# Patient Record
Sex: Male | Born: 1940 | Race: White | Hispanic: No | Marital: Married
Health system: Southern US, Community
[De-identification: ages and names within clinical notes are randomized; demographics above are authoritative.]

---

## 2006-05-06 ENCOUNTER — Ambulatory Visit: Payer: Self-pay | Admitting: Urology

## 2008-04-29 ENCOUNTER — Observation Stay: Payer: Self-pay | Admitting: Internal Medicine

## 2009-11-26 ENCOUNTER — Ambulatory Visit: Payer: Self-pay | Admitting: Internal Medicine

## 2010-01-24 ENCOUNTER — Inpatient Hospital Stay: Payer: Self-pay | Admitting: Internal Medicine

## 2010-03-03 ENCOUNTER — Encounter: Payer: Self-pay | Admitting: Internal Medicine

## 2010-03-21 ENCOUNTER — Encounter: Payer: Self-pay | Admitting: Internal Medicine

## 2010-04-21 ENCOUNTER — Encounter: Payer: Self-pay | Admitting: Internal Medicine

## 2010-07-26 ENCOUNTER — Observation Stay: Payer: Self-pay | Admitting: Internal Medicine

## 2010-08-17 ENCOUNTER — Ambulatory Visit: Payer: Self-pay | Admitting: Urology

## 2010-10-15 ENCOUNTER — Inpatient Hospital Stay: Payer: Self-pay | Admitting: Internal Medicine

## 2010-10-15 ENCOUNTER — Ambulatory Visit: Payer: Self-pay | Admitting: Internal Medicine

## 2010-10-17 LAB — AFP TUMOR MARKER: AFP-Tumor Marker: 25.2 ng/mL — ABNORMAL HIGH (ref 0.0–8.3)

## 2010-10-17 LAB — CEA: CEA: 0.6 ng/mL (ref 0.0–4.7)

## 2010-10-17 LAB — CANCER ANTIGEN 19-9: CA 19-9: 9 U/mL (ref 0–35)

## 2010-10-18 ENCOUNTER — Ambulatory Visit: Payer: Self-pay | Admitting: Oncology

## 2010-10-20 ENCOUNTER — Ambulatory Visit: Payer: Self-pay | Admitting: Oncology

## 2010-10-27 ENCOUNTER — Ambulatory Visit: Payer: Self-pay | Admitting: Oncology

## 2010-11-03 LAB — PATHOLOGY REPORT

## 2010-11-20 ENCOUNTER — Ambulatory Visit: Payer: Self-pay | Admitting: Oncology

## 2010-12-03 ENCOUNTER — Ambulatory Visit: Payer: Self-pay | Admitting: Gastroenterology

## 2010-12-07 LAB — PATHOLOGY REPORT

## 2010-12-20 ENCOUNTER — Ambulatory Visit: Payer: Self-pay | Admitting: Oncology

## 2010-12-20 ENCOUNTER — Ambulatory Visit: Payer: Self-pay | Admitting: Internal Medicine

## 2011-01-11 ENCOUNTER — Inpatient Hospital Stay: Payer: Self-pay | Admitting: Oncology

## 2011-01-20 ENCOUNTER — Ambulatory Visit: Payer: Self-pay | Admitting: Oncology

## 2011-02-20 ENCOUNTER — Ambulatory Visit: Payer: Self-pay | Admitting: Oncology

## 2011-03-22 ENCOUNTER — Ambulatory Visit: Payer: Self-pay | Admitting: Oncology

## 2011-03-22 ENCOUNTER — Ambulatory Visit: Payer: Self-pay | Admitting: Internal Medicine

## 2011-03-23 ENCOUNTER — Inpatient Hospital Stay: Payer: Self-pay | Admitting: Internal Medicine

## 2011-05-22 ENCOUNTER — Ambulatory Visit: Payer: Self-pay | Admitting: Internal Medicine

## 2011-06-08 ENCOUNTER — Inpatient Hospital Stay: Payer: Self-pay | Admitting: Internal Medicine

## 2011-06-22 ENCOUNTER — Ambulatory Visit: Payer: Self-pay | Admitting: Internal Medicine

## 2011-07-23 DEATH — deceased

## 2011-09-13 IMAGING — CT CT ABDOMEN AND PELVIS WITHOUT AND WITH CONTRAST
2 of 4 series · 13 of 32 positions shown, 18 images · IV contrast (isovue)
Comparison: none

REASON FOR EXAM: (1) cholangiocarcinoma, pain, severe periferal edema.;
(2) cholangiocarcinoma, p
COMMENTS:   LMP: (Male)

PROCEDURE:     CT  - CT ABDOMEN / PELVIS  W/WO  - January 13, 2011 [DATE]
RESULT:     Comparison:  CT the abdomen and pelvis 10/16/2010 and MRI the
abdomen 10/17/2010. PET CT 10/19/2010.
TECHNIQUE: Multiple axial images of the abdomen and pelvis were performed
from the lung bases to the pubic symphysis, with p.o. contrast and with 100
mL of Isovue 370 intravenous contrast. Precontrast and delayed phase images
were performed.

[Series 2: wo · axial · 0.98mm/px · z∈[+100,+470]mm · 8 of 96 slices shown, 13 images]
[im 11/96  soft-tissue]
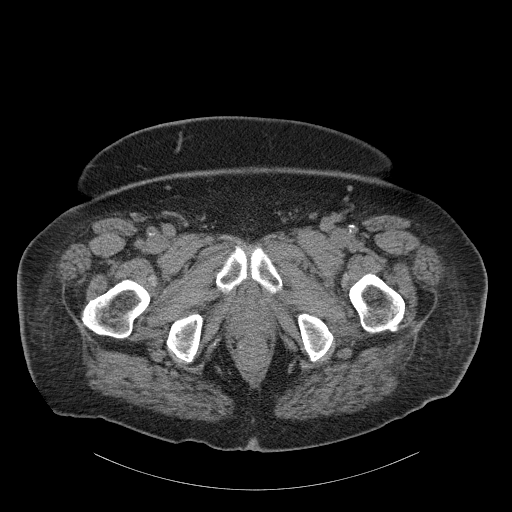
[im 11/96  bone]
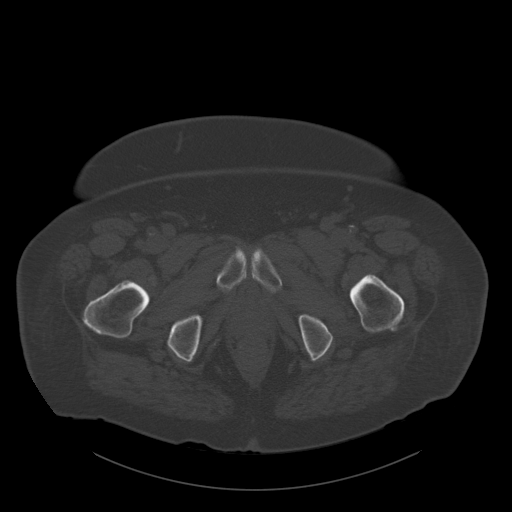
[im 22/96  soft-tissue]
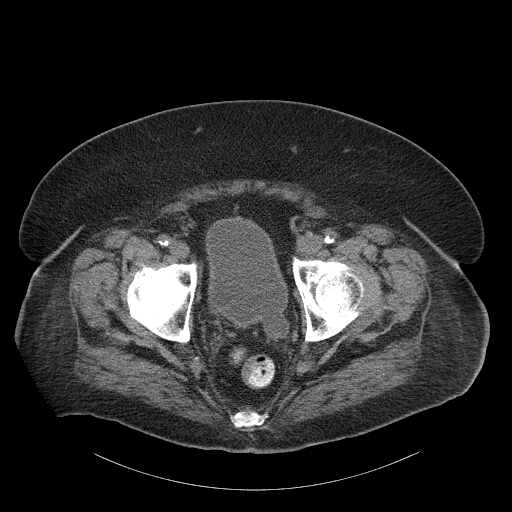
[im 32/96  soft-tissue]
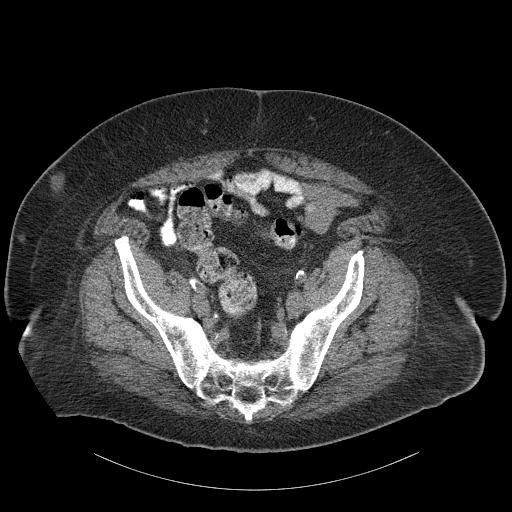
[im 43/96  soft-tissue]
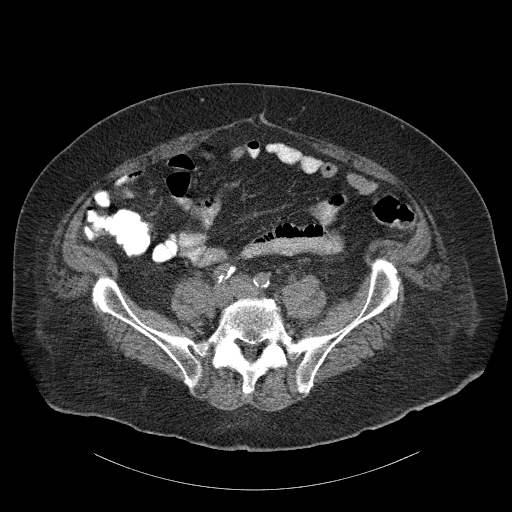
[im 53/96  soft-tissue]
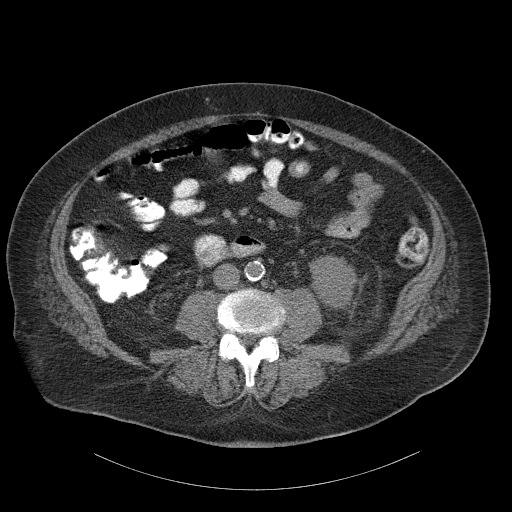
[im 53/96  lung]
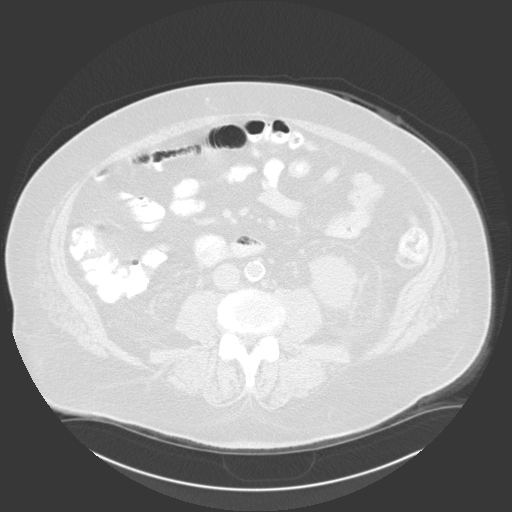
[im 64/96  soft-tissue]
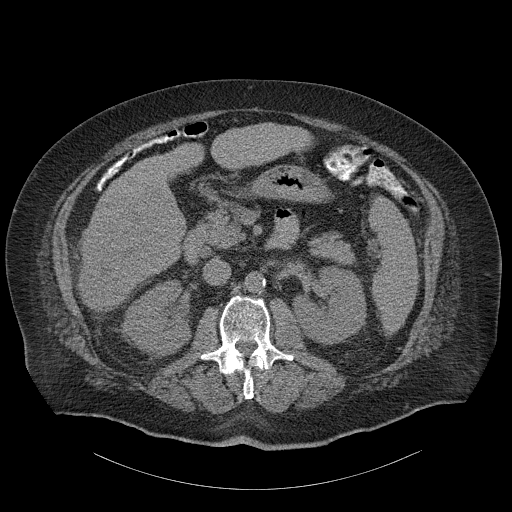
[im 64/96  lung]
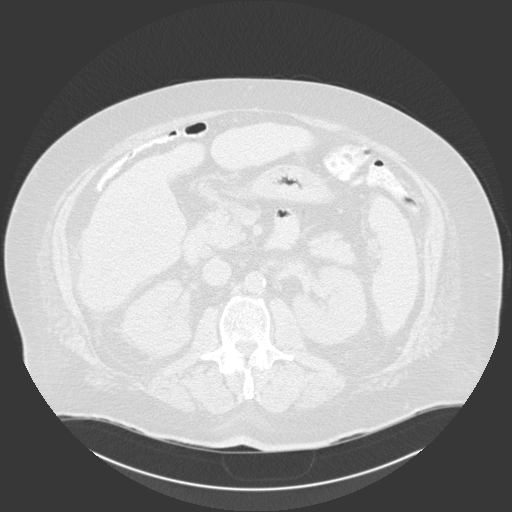
[im 74/96  soft-tissue]
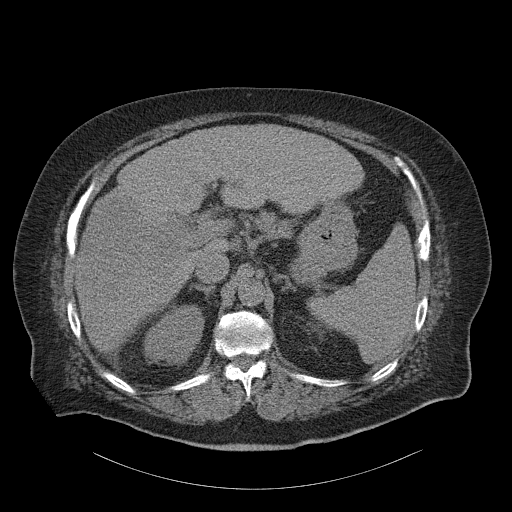
[im 74/96  lung]
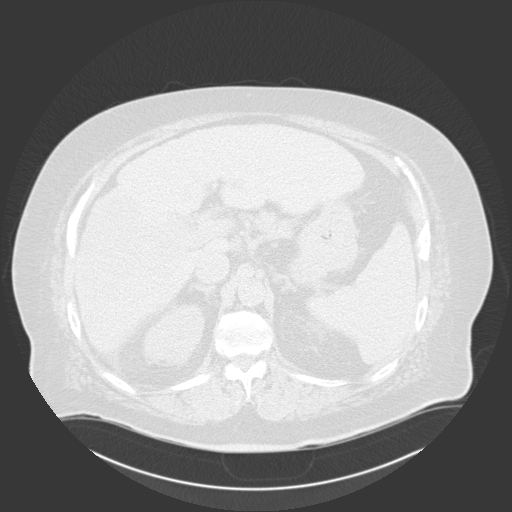
[im 85/96  soft-tissue]
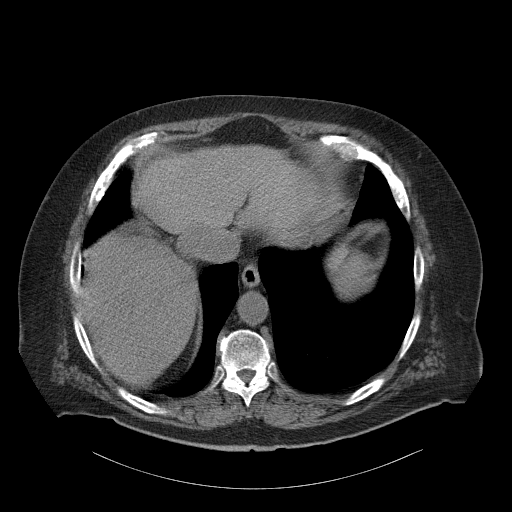
[im 85/96  lung]
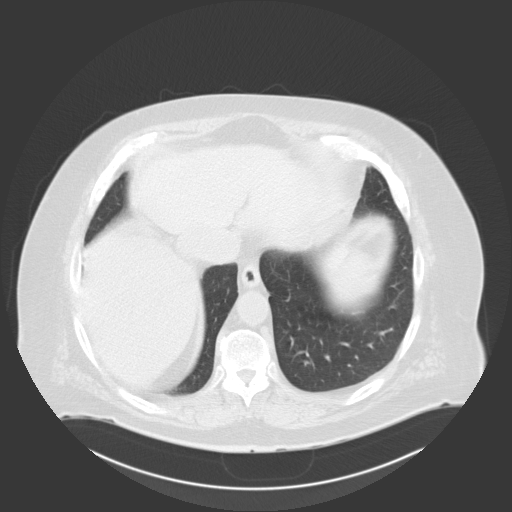

[Series 3: with · axial · 0.98mm/px · z∈[+100,+310]mm · 5 of 96 slices shown]
[im 11/96  soft-tissue]
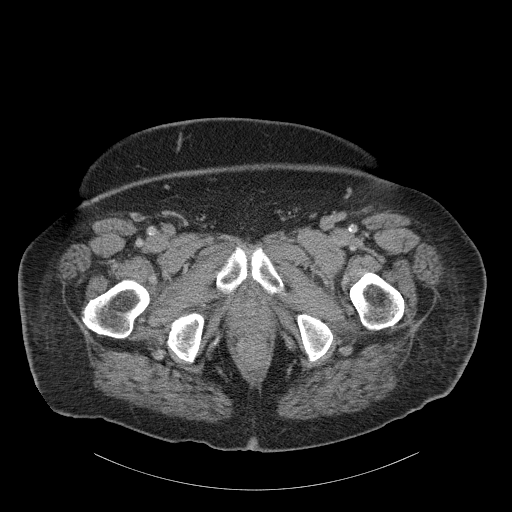
[im 22/96  soft-tissue]
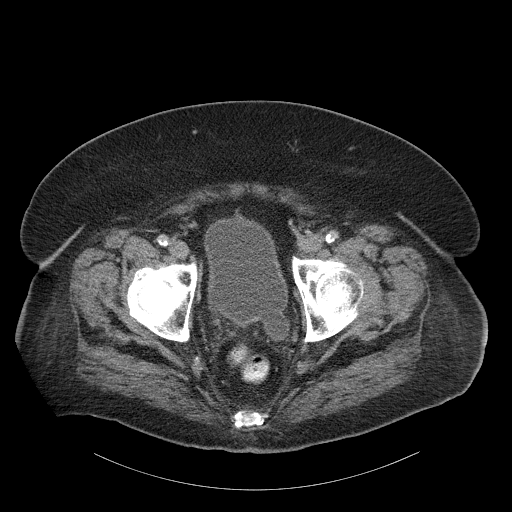
[im 32/96  soft-tissue]
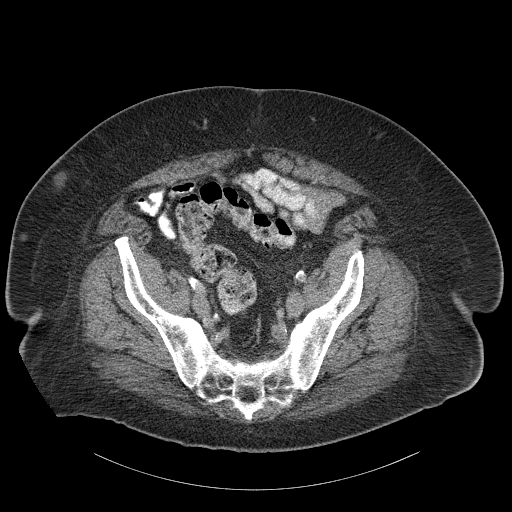
[im 43/96  soft-tissue]
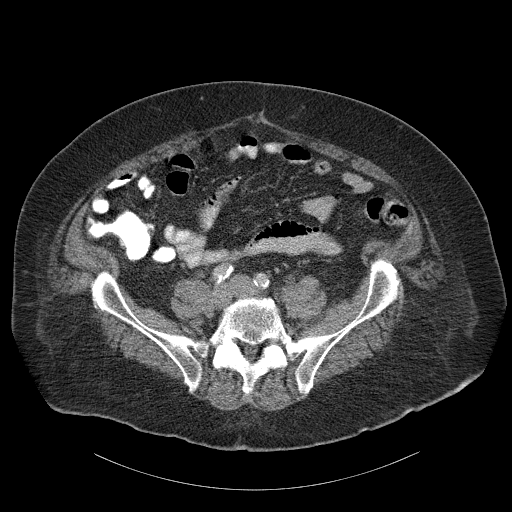
[im 53/96  soft-tissue]
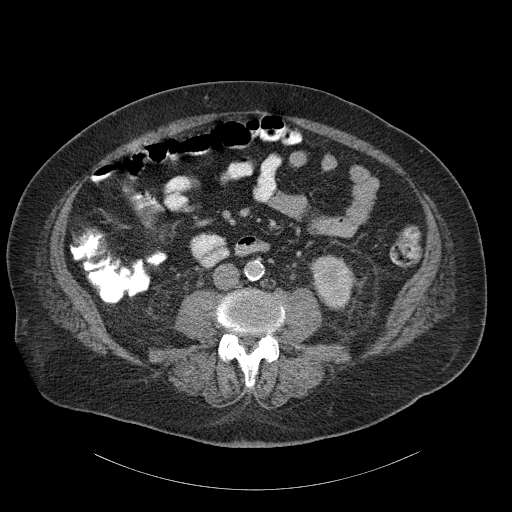

[13 of 32 positions shown; findings below may reference images not displayed]

FINDINGS: There is an indeterminate nodule in the peripheral right lower lobe
measuring 7 mm.

Again demonstrated is heterogeneous enhancement in the right hepatic lobe
seen with the patient's history of cholangiocarcinoma. Comparison with prior
is difficult secondary to the phase of contrast enhancement. However, this
appears increased in size. It measures approximately 9.4 x 8.5 cm in
greatest axial dimension. There is heterogeneous enhancement extending along
the course of the middle hepatic vein. This is hypermetabolic on the prior
PET, and concerning for tumor thrombus within the middle hepatic vein. This
extends into the lumen of the inferior vena cava, just above the diaphragm
and just inferior to the right atrium.

The spleen, adrenals, and pancreas are unremarkable. There is mild
nonspecific perinephric stranding. No hydronephrosis.

The small and large bowel are normal in caliber. There is a diverticulum
extending from the left lateral the bladder. There are multiple small foci
of soft tissue density in the right abdominal subcutaneous fat, likely
related to injections.

No aggressive lytic or sclerotic osseous lesions identified.
IMPRESSION: 1. Findings concerning for tumor thrombus invading the middle hepatic vein
and extending into the lumen of the inferior vena cava, just inferior to the
right atrium.
2. Findings concerning for progression of disease in the right hepatic lobe.
3. Indeterminate 7 mm nodule in the right lower lobe.

## 2014-10-13 NOTE — Discharge Summary (Signed)
PATIENT NAME:  Cody NorrisASTWOOD, Shelden W MR#:  [id number] DATE OF BIRTH:  April 25, 1941  DATE OF ADMISSION:  06/08/2011 DATE OF DISCHARGE:  06/10/2011  REASON FOR ADMISSION: Bilateral lower extremity pain, swelling, and redness.   HISTORY OF PRESENT ILLNESS: Please see the dictated History and Physical done by Dr. Kathe MarinerJ. Patel on 06/08/2011.   PAST MEDICAL HISTORY:  1. Hepatic cancer being followed by oncology.  2. 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  3. Ischemic cardiomyopathy.  4. Benign hypertension.  5. Hyperlipidemia.  6. Morbid obesity.  7. Benign prostatic hypertrophy.  8. Chronic venous stasis.  9. History of pulmonary embolism.   MEDICATIONS ON ADMISSION: Please see admission note.   ALLERGIES: Shellfish.   SOCIAL HISTORY, FAMILY HISTORY, REVIEW OF SYSTEMS: As per admission note.   PHYSICAL EXAM: The patient was in no acute distress. Vital signs were stable and he was afebrile. HEENT exam was unremarkable. Neck was supple without jugular venous distention. Lungs were clear. Cardiac exam revealed a regular rate and rhythm. Normal S1, S2. Abdomen was soft and nontender. Extremities revealed 2 to 3+ edema with erythema.  Neurologic exam was grossly nonfocal.   HOSPITAL COURSE: The patient was admitted with lower extremity pain and swelling consistent with cellulitis. Blood cultures were obtained and he was started on IV antibiotics. Bilateral lower extremity Doppler's were negative for deep vein thrombosis. The patient's symptoms improved with antibiotics. Blood cultures returned negative after 48 hours. He switched over to oral antibiotics and continued to do well. By 06/10/2011, the patient was stable and ready for discharge.   DISCHARGE DIAGNOSES:  1. Bilateral lower extremity cellulitis.  2. Venostasis with peripheral edema.  3. Lower extremity pain.  4. Hepatic cancer.  5. Morbid obesity.  6. Atherosclerotic cardiovascular  disease.  7. Benign hypertension.   DISCHARGE MEDICATIONS:  1. Roxanol per Hospice protocol.  2. Demadex 20 mg p.o. daily.  3. Celexa 20 mg p.o. daily.  4. Omeprazole 20 mg p.o. daily.  5. Aspirin 81 mg p.o. daily.  6. Imdur 60 mg p.o. daily.  7. Zestril 5 mg p.o. daily.  8. Lanoxin 0.125 mg p.o. daily.  9. Cardizem CD 240 mg p.o. daily.  10. Lopressor 25 mg p.o. b.i.d.  11. Ativan 1 mg p.o. q. 4 hours p.r.n. anxiety.  12. Ibuprofen 400 mg p.o. q. 6 hours p.r.n. pain.  13. Atarax 25 mg p.o. q. 4 hours p.r.n. itching.  14. Duragesic 50 mcg topically q. 3 days.  15. Ceftin 500 mg p.o. b.i.d. times 10 days.   FOLLOW-UP PLANS AND APPOINTMENTS: The patient was discharged on 3 liters of oxygen. He was on a low sodium diet. He will be followed by Hospice. He is a DO NOT RESUSCITATE/ NO CODE BLUE. He will follow up with me in 1 to 2 weeks, sooner if needed.  ____________________________ Duane LopeJeffrey D. Judithann SheenSparks, MD jds:bjt D:  06/20/2011 19:37:13 ET         T: 06/21/2011 09:51:55 ET         JOB#: [id number]  Ambri Miltner D Genea Rheaume MD ELECTRONICALLY SIGNED 06/23/2011 9:56
